# Patient Record
Sex: Female | Born: 1985 | Race: Black or African American | Hispanic: No | Marital: Married | Smoking: Former smoker
Health system: Southern US, Community
[De-identification: ages and names within clinical notes are randomized; demographics above are authoritative.]

## PROBLEM LIST (undated history)

## (undated) DIAGNOSIS — I471 Supraventricular tachycardia, unspecified: Secondary | ICD-10-CM

## (undated) HISTORY — PX: OTHER SURGICAL HISTORY: SHX169

## (undated) HISTORY — DX: Supraventricular tachycardia, unspecified: I47.10

## (undated) HISTORY — DX: Supraventricular tachycardia: I47.1

---

## 2006-05-20 ENCOUNTER — Ambulatory Visit: Payer: Self-pay | Admitting: Internal Medicine

## 2006-10-04 ENCOUNTER — Ambulatory Visit: Payer: Self-pay | Admitting: Family Medicine

## 2006-12-28 ENCOUNTER — Emergency Department (HOSPITAL_COMMUNITY): Admission: EM | Admit: 2006-12-28 | Discharge: 2006-12-28 | Payer: Self-pay | Admitting: Emergency Medicine

## 2007-08-21 ENCOUNTER — Ambulatory Visit: Payer: Self-pay | Admitting: Internal Medicine

## 2008-09-16 IMAGING — CR DG LUMBAR SPINE COMPLETE 4+V
6 series · 6 of 6 positions shown · non-contrast
Comparison: none

CORRECTED EXAM 12/31/06 ? This exam was cancelled after it was ordered, but the tech inadvertently performed the exam in error.  The patient will not be charged for this exam.
CLINICAL DATA: Severe back pain.  The patient was not charged for this exam, because she had already had the exam earlier today. 
 LUMBAR SPINE ? 6 VIEW:

[t l-spine oblique exposure (1 of 3)]
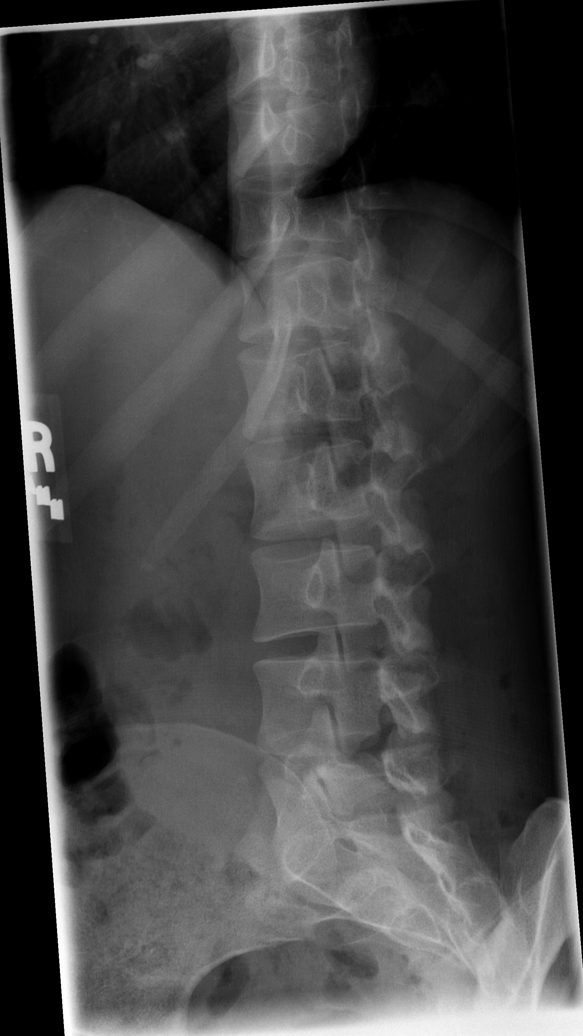

[t l-spine oblique exposure (2 of 3)]
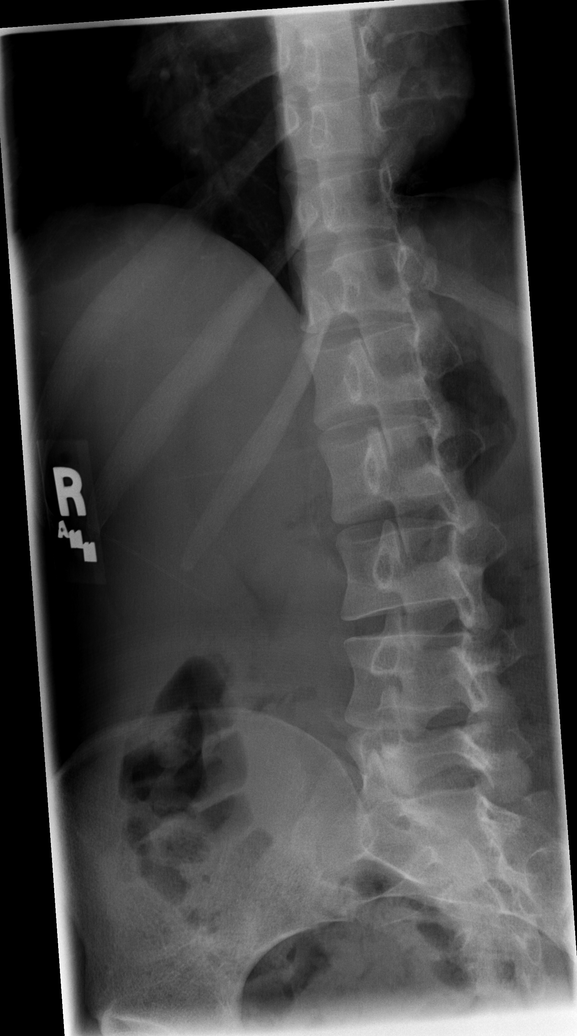

[t l-spine oblique exposure (3 of 3)]
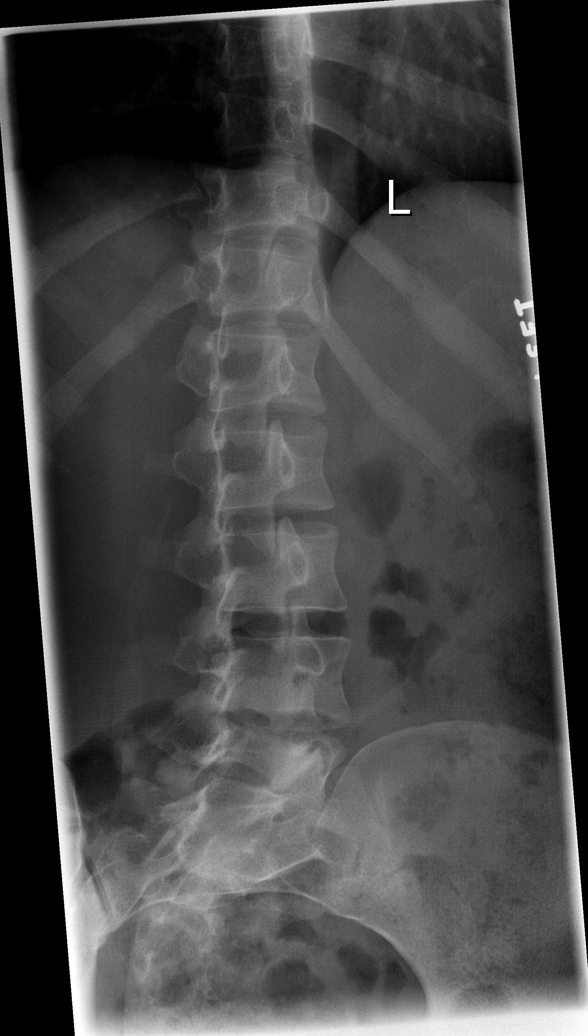

[t l-spine lat]
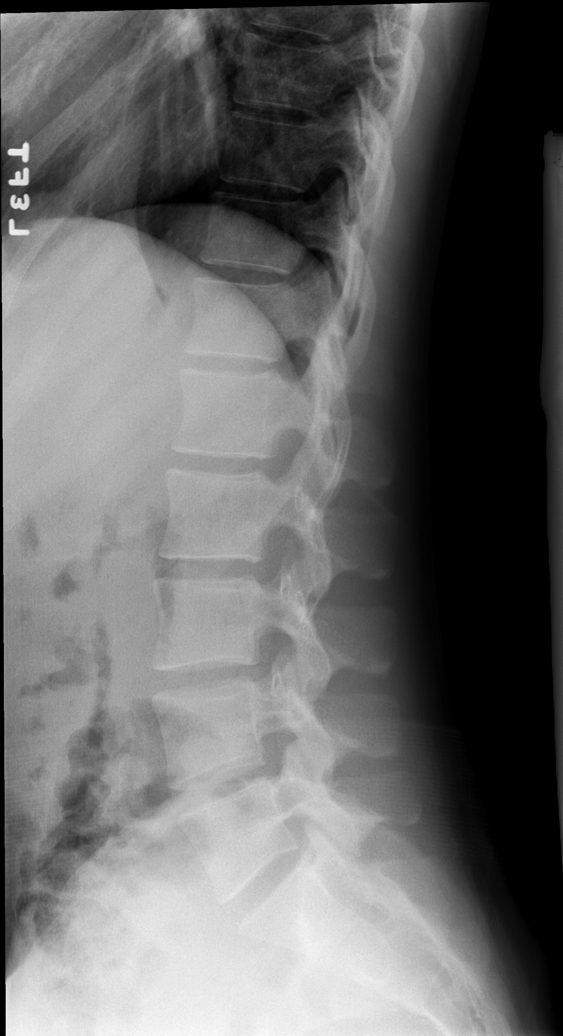

[t l-spine l5-s1 spot]
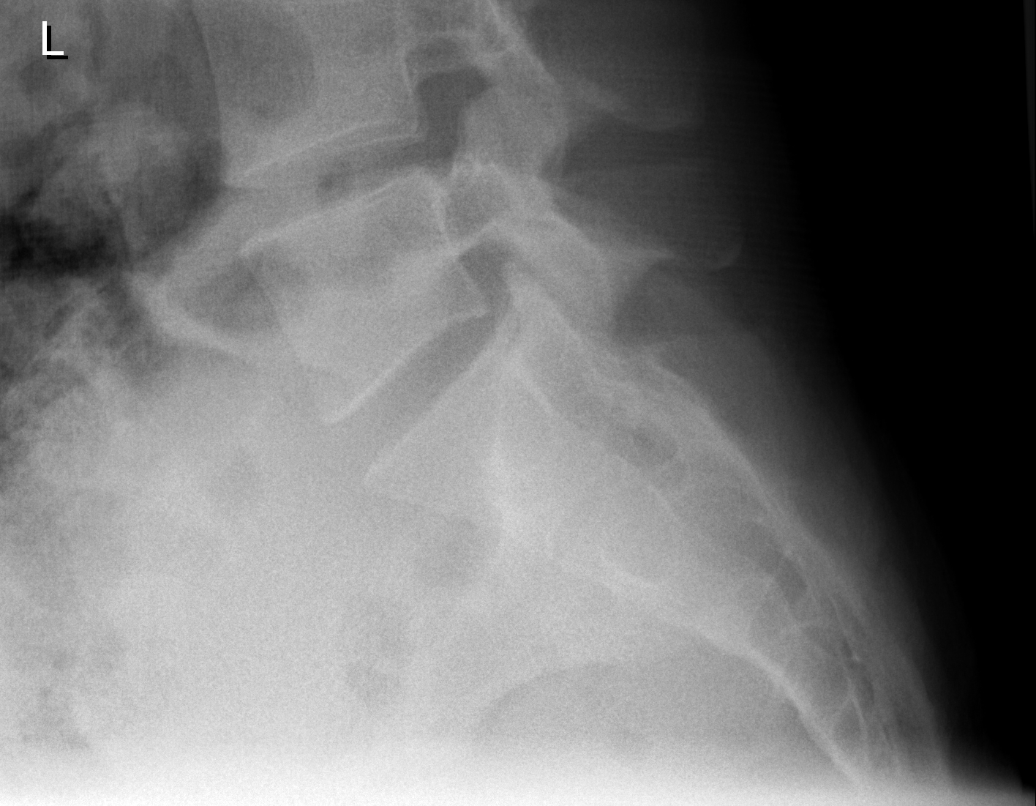

[t l-spine a.p.]
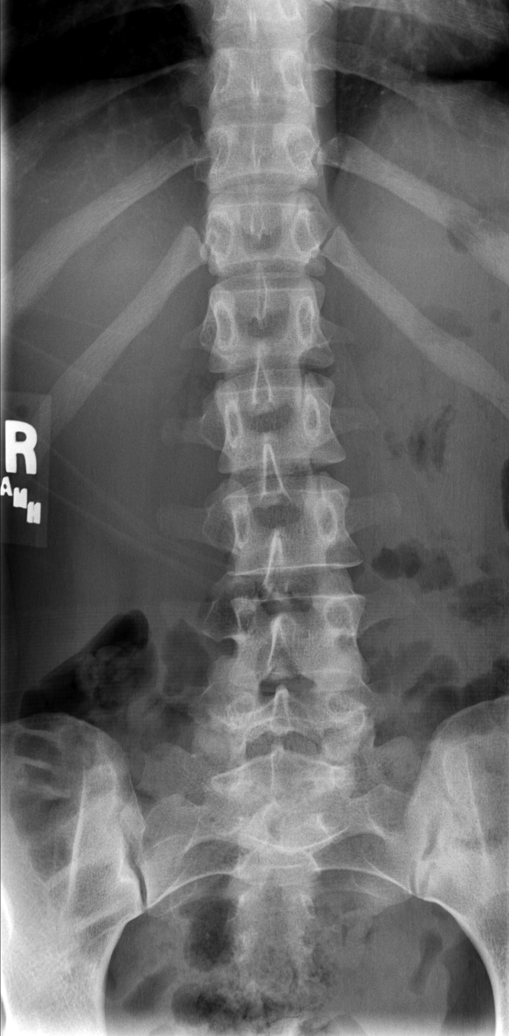

[6 of 6 positions shown; findings below may reference images not displayed]

FINDINGS: There is no evidence of lumbar spine fracture.  Alignment is normal.  Intervertebral disc spaces are maintained, and no other significant bone abnormalities are identified.
IMPRESSION: Negative lumbar spine radiographs.

## 2009-01-04 DIAGNOSIS — R000 Tachycardia, unspecified: Secondary | ICD-10-CM | POA: Insufficient documentation

## 2009-01-18 ENCOUNTER — Encounter (INDEPENDENT_AMBULATORY_CARE_PROVIDER_SITE_OTHER): Payer: Self-pay | Admitting: *Deleted

## 2011-02-01 LAB — URINALYSIS, ROUTINE W REFLEX MICROSCOPIC
Glucose, UA: NEGATIVE
Protein, ur: 30 — AB
pH: 6

## 2011-02-01 LAB — URINE MICROSCOPIC-ADD ON

## 2011-02-01 LAB — POCT PREGNANCY, URINE: Preg Test, Ur: NEGATIVE

## 2015-11-13 ENCOUNTER — Emergency Department (HOSPITAL_COMMUNITY)
Admission: EM | Admit: 2015-11-13 | Discharge: 2015-11-14 | Disposition: A | Discharge status: Home / Self Care | Payer: Self-pay | Attending: Dermatology | Admitting: Dermatology

## 2015-11-13 ENCOUNTER — Encounter (HOSPITAL_COMMUNITY): Payer: Self-pay | Admitting: Emergency Medicine

## 2015-11-13 ENCOUNTER — Emergency Department (HOSPITAL_COMMUNITY): Payer: Self-pay

## 2015-11-13 DIAGNOSIS — R202 Paresthesia of skin: Secondary | ICD-10-CM | POA: Insufficient documentation

## 2015-11-13 DIAGNOSIS — Z5321 Procedure and treatment not carried out due to patient leaving prior to being seen by health care provider: Secondary | ICD-10-CM | POA: Insufficient documentation

## 2015-11-13 DIAGNOSIS — Z87891 Personal history of nicotine dependence: Secondary | ICD-10-CM | POA: Insufficient documentation

## 2015-11-13 LAB — BASIC METABOLIC PANEL
ANION GAP: 4 — AB (ref 5–15)
BUN: 12 mg/dL (ref 6–20)
CALCIUM: 9.8 mg/dL (ref 8.9–10.3)
CO2: 27 mmol/L (ref 22–32)
Chloride: 110 mmol/L (ref 101–111)
Creatinine, Ser: 0.63 mg/dL (ref 0.44–1.00)
GFR calc Af Amer: >60 mL/min (ref >60)
GLUCOSE: 87 mg/dL (ref 65–99)
POTASSIUM: 4.3 mmol/L (ref 3.5–5.1)
SODIUM: 141 mmol/L (ref 135–145)

## 2015-11-13 LAB — I-STAT TROPONIN, ED: TROPONIN I, POC: 0.01 ng/mL (ref 0.00–0.08)

## 2015-11-13 LAB — CBC
HEMATOCRIT: 40.8 % (ref 36.0–46.0)
HEMOGLOBIN: 13.7 g/dL (ref 12.0–15.0)
MCH: 33.1 pg (ref 26.0–34.0)
MCHC: 33.6 g/dL (ref 30.0–36.0)
MCV: 98.6 fL (ref 78.0–100.0)
Platelets: 206 10*3/uL (ref 150–400)
RBC: 4.14 MIL/uL (ref 3.87–5.11)
RDW: 12.2 % (ref 11.5–15.5)
WBC: 12.3 10*3/uL — AB (ref 4.0–10.5)

## 2015-11-13 NOTE — ED Triage Notes (Signed)
Pt states about an hour ago she had a sudden onset of chest tightness and tingling in her arms/hands. Pt seemed anxious at first with HR of 140, pt now calmed down and HR 112. Pt states tingling as eased up. Reports this has happened before but "they/ve never figured out why this is happening."

## 2015-11-14 ENCOUNTER — Encounter (HOSPITAL_COMMUNITY): Payer: Self-pay

## 2015-11-14 ENCOUNTER — Emergency Department (HOSPITAL_COMMUNITY)
Admission: EM | Admit: 2015-11-14 | Discharge: 2015-11-14 | Disposition: A | Discharge status: Home / Self Care | Payer: BLUE CROSS/BLUE SHIELD | Attending: Emergency Medicine | Admitting: Emergency Medicine

## 2015-11-14 DIAGNOSIS — R202 Paresthesia of skin: Secondary | ICD-10-CM | POA: Diagnosis not present

## 2015-11-14 DIAGNOSIS — R002 Palpitations: Secondary | ICD-10-CM

## 2015-11-14 DIAGNOSIS — R0789 Other chest pain: Secondary | ICD-10-CM | POA: Insufficient documentation

## 2015-11-14 DIAGNOSIS — Z87891 Personal history of nicotine dependence: Secondary | ICD-10-CM | POA: Diagnosis not present

## 2015-11-14 DIAGNOSIS — R0602 Shortness of breath: Secondary | ICD-10-CM | POA: Diagnosis present

## 2015-11-14 LAB — BASIC METABOLIC PANEL
Anion gap: 7 (ref 5–15)
BUN: 9 mg/dL (ref 6–20)
CHLORIDE: 108 mmol/L (ref 101–111)
CO2: 21 mmol/L — AB (ref 22–32)
Calcium: 9.5 mg/dL (ref 8.9–10.3)
Creatinine, Ser: 0.62 mg/dL (ref 0.44–1.00)
GFR calc non Af Amer: >60 mL/min (ref >60)
Glucose, Bld: 88 mg/dL (ref 65–99)
POTASSIUM: 3.8 mmol/L (ref 3.5–5.1)
SODIUM: 136 mmol/L (ref 135–145)

## 2015-11-14 LAB — CBC WITH DIFFERENTIAL/PLATELET
Basophils Absolute: 0 10*3/uL (ref 0.0–0.1)
Basophils Relative: 0 %
Eosinophils Absolute: 0 10*3/uL (ref 0.0–0.7)
Eosinophils Relative: 0 %
HEMATOCRIT: 39.1 % (ref 36.0–46.0)
HEMOGLOBIN: 13.3 g/dL (ref 12.0–15.0)
LYMPHS ABS: 2.2 10*3/uL (ref 0.7–4.0)
LYMPHS PCT: 27 %
MCH: 32.4 pg (ref 26.0–34.0)
MCHC: 34 g/dL (ref 30.0–36.0)
MCV: 95.1 fL (ref 78.0–100.0)
MONOS PCT: 7 %
Monocytes Absolute: 0.5 10*3/uL (ref 0.1–1.0)
NEUTROS PCT: 66 %
Neutro Abs: 5.4 10*3/uL (ref 1.7–7.7)
Platelets: 198 10*3/uL (ref 150–400)
RBC: 4.11 MIL/uL (ref 3.87–5.11)
RDW: 12.4 % (ref 11.5–15.5)
WBC: 8.2 10*3/uL (ref 4.0–10.5)

## 2015-11-14 NOTE — ED Triage Notes (Signed)
Pt here yesterday for same. States she was at work and felt her heart increase in rate.  Felt dizzy with chest tightness.  Shortness of breath.  Lasted over an hour.  Came to ED.  Labs and EKG completed and triaged to waiting.  Left d/t wait times.  Pt denies symptoms since.  States she has had this in past.

## 2015-11-14 NOTE — ED Provider Notes (Signed)
[facility]PT Provider Note   CSN: [id number] Arrival date & time: 11/14/15  4628  First Provider Contact:  10:45      History   Chief Complaint Chief Complaint  Patient presents with  . Palpitations    HPI Julia Cox is a 30 y.o. female here for evaluation of palpitations. Patient reports yesterday at work, while standing she suddenly felt very hot, felt like her heart was racing, became short of breath with chest tightness and experienced bilateral numbness and tingling in her hands. Symptoms lasted approximately one hour and resolved spontaneously. She denies any exertional component. States he works out regularly and has never felt this discomfort. She does admit to having a 5 hour energy supplement prior to this episode. Denies any discomfort now in the emergency department states she came to ED last night, had multiple tests done, but left before being seen by provider.  HPI  No past medical history on file.  Patient Active Problem List   Diagnosis Date Noted  . 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  OB History    No data available       Home Medications    Prior to Admission medications   Not on File    Family History No family history on file.  Social History Social History  Substance Use Topics  . Smoking status: Former Games developer  . Smokeless tobacco: Not on file  . Alcohol use No     Allergies   Prednisone   Review of Systems Review of Systems A 10 point review of systems was completed and was negative except for pertinent positives and negatives as mentioned in the history of present illness    Physical Exam Updated Vital Signs BP 101/72   Pulse 70   Temp 98.4 F (36.9 C) (Oral)   Resp 18   LMP 11/05/2015   SpO2 99%   Physical Exam  Constitutional: She appears well-developed. No distress.  Awake, alert and nontoxic in appearance  HENT:  Head: Normocephalic and atraumatic.  Right Ear: External ear  normal.  Left Ear: External ear normal.  Mouth/Throat: Oropharynx is clear and moist.  Eyes: Conjunctivae and EOM are normal. Pupils are equal, round, and reactive to light.  Neck: Normal range of motion. No JVD present.  Cardiovascular: Normal rate, regular rhythm and normal heart sounds.   Pulmonary/Chest: Effort normal and breath sounds normal. No stridor.  Abdominal: Soft. There is no tenderness.  Musculoskeletal: Normal range of motion.  Neurological:  Awake, alert, cooperative and aware of situation; motor strength bilaterally; sensation normal to light touch bilaterally; no facial asymmetry; tongue midline; major cranial nerves appear intact;  baseline gait without new ataxia.  Skin: No rash noted. She is not diaphoretic.  Psychiatric: She has a normal mood and affect. Her behavior is normal. Thought content normal.  Nursing note and vitals reviewed.    ED Treatments / Results  Labs (all labs ordered are listed, but only abnormal results are displayed) Labs Reviewed  BASIC METABOLIC PANEL - Abnormal; Notable for the following:       Result Value   CO2 21 (*)    All other components within normal limits  CBC WITH DIFFERENTIAL/PLATELET    EKG  EKG Interpretation  Date/Time:  Tuesday November 14 2015 07:50:12 EDT Ventricular Rate:  95 PR Interval:    QRS Duration: 86 QT Interval:  329 QTC Calculation: 414 R Axis:   94 Text Interpretation:  Sinus rhythm  Borderline short PR interval Right atrial enlargement Borderline right axis deviation Borderline repolarization abnormality When compared with ECG of 11/13/2015 Rate slower Otherwise no significant change Confirmed by [facility]  MD, Nicholos Johns [phone]) on 11/14/2015 10:13:04 AM       Radiology Dg Chest 2 View  Result Date: 11/13/2015 CLINICAL DATA:  Chest pain and tightness, initial encounter EXAM: CHEST  2 VIEW COMPARISON:  None. FINDINGS: The heart size and mediastinal contours are within normal limits. Both lungs are clear.  The visualized skeletal structures are unremarkable. IMPRESSION: No active cardiopulmonary disease. Electronically Signed   By: Alcide Clever M.D.   On: 11/13/2015 20:01   Procedures Procedures (including critical care time)  Medications Ordered in ED Medications - No data to display   Initial Impression / Assessment and Plan / ED Course  I have reviewed the triage vital signs and the nursing notes.  Pertinent labs & imaging results that were available during my care of the patient were reviewed by me and considered in my medical decision making (see chart for details).  Clinical Course    Patient with palpitations, concomitant shortness of breath and chest tightness, bilateral hand paresthesia. Symptoms possibly due to anxiety. Patient also admits to drinking an energy drink prior to symptom onset. I discussed cessation of energy during until reevaluated by cardiology. Exam was reassuring. EKG unremarkable. Screening labs, troponin and chest x-ray ordered yesterday are negative. Repeat labs today are reassuring and unremarkable. Given referral to cardiology for reevaluation. Patient denies he discomfort now, appears very well and appropriate for outpatient follow-up. Discussed strict return precautions with patient and family bedside, verbalize understanding and agreement plan and subsequent discharge Prior to patient discharge, I discussed and reviewed this case with Dr.McManus  Final Clinical Impressions(s) / ED Diagnoses   Final diagnoses:  Palpitations    New Prescriptions There are no discharge medications for this patient.    Joycie Peek, PA-C 11/15/15 5638    Samuel Jester, DO 11/18/15 1213

## 2015-11-14 NOTE — ED Notes (Signed)
Writer call for room assignment, no response  

## 2015-11-14 NOTE — Discharge Instructions (Signed)
Please follow-up with your doctor and cardiology for further evaluation and management of your symptoms. Your exam today was reassuring as were her labs. Avoid drinking energy drinks as this can exacerbate your symptoms. Return to ED for new or worsening symptoms as we discussed.

## 2016-04-28 ENCOUNTER — Encounter (HOSPITAL_COMMUNITY): Payer: Self-pay | Admitting: *Deleted

## 2016-04-28 ENCOUNTER — Emergency Department (HOSPITAL_COMMUNITY)
Admission: EM | Admit: 2016-04-28 | Discharge: 2016-04-28 | Disposition: A | Discharge status: Home / Self Care | Payer: BLUE CROSS/BLUE SHIELD | Attending: Emergency Medicine | Admitting: Emergency Medicine

## 2016-04-28 ENCOUNTER — Emergency Department (HOSPITAL_COMMUNITY): Payer: BLUE CROSS/BLUE SHIELD

## 2016-04-28 DIAGNOSIS — Z87891 Personal history of nicotine dependence: Secondary | ICD-10-CM | POA: Insufficient documentation

## 2016-04-28 DIAGNOSIS — R000 Tachycardia, unspecified: Secondary | ICD-10-CM | POA: Insufficient documentation

## 2016-04-28 LAB — COMPREHENSIVE METABOLIC PANEL
ALT: 17 U/L (ref 14–54)
AST: 24 U/L (ref 15–41)
Albumin: 4.5 g/dL (ref 3.5–5.0)
Alkaline Phosphatase: 54 U/L (ref 38–126)
Anion gap: 5 (ref 5–15)
BUN: 5 mg/dL — AB (ref 6–20)
CHLORIDE: 113 mmol/L — AB (ref 101–111)
CO2: 19 mmol/L — AB (ref 22–32)
CREATININE: 0.51 mg/dL (ref 0.44–1.00)
Calcium: 9.8 mg/dL (ref 8.9–10.3)
GFR calc Af Amer: >60 mL/min (ref >60)
Glucose, Bld: 90 mg/dL (ref 65–99)
Potassium: 3.9 mmol/L (ref 3.5–5.1)
Sodium: 137 mmol/L (ref 135–145)
Total Bilirubin: 0.8 mg/dL (ref 0.3–1.2)
Total Protein: 7.4 g/dL (ref 6.5–8.1)

## 2016-04-28 LAB — CBC WITH DIFFERENTIAL/PLATELET
BASOS ABS: 0 10*3/uL (ref 0.0–0.1)
Basophils Relative: 0 %
EOS PCT: 0 %
Eosinophils Absolute: 0 10*3/uL (ref 0.0–0.7)
HCT: 39.7 % (ref 36.0–46.0)
Hemoglobin: 14 g/dL (ref 12.0–15.0)
LYMPHS PCT: 34 %
Lymphs Abs: 3 10*3/uL (ref 0.7–4.0)
MCH: 33.2 pg (ref 26.0–34.0)
MCHC: 35.3 g/dL (ref 30.0–36.0)
MCV: 94.1 fL (ref 78.0–100.0)
Monocytes Absolute: 0.6 10*3/uL (ref 0.1–1.0)
Monocytes Relative: 7 %
NEUTROS ABS: 5.3 10*3/uL (ref 1.7–7.7)
NEUTROS PCT: 59 %
PLATELETS: 174 10*3/uL (ref 150–400)
RBC: 4.22 MIL/uL (ref 3.87–5.11)
RDW: 12.2 % (ref 11.5–15.5)
WBC: 8.8 10*3/uL (ref 4.0–10.5)

## 2016-04-28 LAB — TROPONIN I

## 2016-04-28 LAB — TSH: TSH: 1.58 u[IU]/mL (ref 0.350–4.500)

## 2016-04-28 NOTE — ED Notes (Signed)
Pt has no complaints back from xray 5 or 10 minutes

## 2016-04-28 NOTE — Discharge Instructions (Signed)
Tests were all within normal limits. Recommend follow-up with cardiology. [facility] [phone]

## 2016-04-28 NOTE — ED Notes (Addendum)
Informed Dr. Adriana Simasook that pt was hungry and was wondering "what is going on". Pt offered a sandwich and drink but refused, due to being vegan.

## 2016-04-28 NOTE — ED Provider Notes (Signed)
[facility] Provider Note   CSN: [id number] Arrival date & time: 04/28/16  1554     History   Chief Complaint Chief Complaint  Patient presents with  . Tachycardia    HPI Julia Cox is a 31 y.o. female.  Episode of tachycardia earlier today since 10 AM, now resolved. Patient went to the urgent care center where her EKG revealed sinus tachycardia with a rate of 146. No substernal chest pain or dyspnea. Patient has had similar episodes since age 31, worsening since July 2017, approximately 1-2 episodes per month. She had a 30 day Holter  monitor placed years ago which showed no ectopy. 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 No street drugs or tobacco.      History reviewed. No pertinent past medical history.  Patient Active Problem List   Diagnosis Date Noted  . 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 No pertinent surgical history.  OB History    No data available       Home Medications    Prior to Admission medications   Not on File    Family History No family history on file.  Social History Social History  Substance Use Topics  . Smoking status: Former Games developermoker  . Smokeless tobacco: Never Used  . Alcohol use Yes     Allergies   Prednisone   Review of Systems Review of Systems  All other systems reviewed and are negative.    Physical Exam Updated Vital Signs BP 127/93 (BP Location: Right Arm)   Pulse 90   Temp 98.7 F (37.1 C) (Oral)   Resp 11   Ht 5\' 3"  (1.6 m)   Wt 112 lb (50.8 kg)   LMP 04/05/2016   SpO2 100%   BMI 19.84 kg/m   Physical Exam  Constitutional: She is oriented to person, place, and time. She appears well-developed and well-nourished.  HENT:  Head: Normocephalic and atraumatic.  Eyes: Conjunctivae are normal.  Neck: Neck supple.  Cardiovascular: Regular rhythm.   Tachycardia at 110/min  Pulmonary/Chest: Effort normal and breath sounds normal.  Abdominal: Soft. Bowel sounds are normal.    Musculoskeletal: Normal range of motion.  Neurological: She is alert and oriented to person, place, and time.  Skin: Skin is warm and dry.  Psychiatric: She has a normal mood and affect. Her behavior is normal.  Nursing note and vitals reviewed.    ED Treatments / Results  Labs (all labs ordered are listed, but only abnormal results are displayed) Labs Reviewed  COMPREHENSIVE METABOLIC PANEL - Abnormal; Notable for the following:       Result Value   Chloride 113 (*)    CO2 19 (*)    BUN 5 (*)    All other components within normal limits  CBC WITH DIFFERENTIAL/PLATELET  TROPONIN I  TSH    EKG  EKG Interpretation  Date/Time:  Sunday April 28 2016 15:54:23 EST Ventricular Rate:  110 PR Interval:    QRS Duration: 72 QT Interval:  312 QTC Calculation: 422 R Axis:   85 Text Interpretation:  Sinus tachycardia Right atrial enlargement Confirmed by Adriana SimasOOK  MD, Amarilys Lyles ([id number]) on 04/28/2016 4:27:24 PM       Radiology Dg Chest 2 View  Result Date: 04/28/2016 CLINICAL DATA:  31 year old with recurrent episodes of tachycardia, with today's episode starting at approximately 10 o'clock a.m. Her previous episode was approximately 1 month ago. EXAM: CHEST  2 VIEW COMPARISON:  11/13/2015. FINDINGS: Cardiomediastinal silhouette unremarkable,  unchanged. Lungs clear. Bronchovascular markings normal. Pulmonary vascularity normal. No visible pleural effusions. No pneumothorax. Visualized bony thorax intact. No interval change. IMPRESSION: Normal examination. Electronically Signed   By: Hulan Saas M.D.   On: 04/28/2016 17:38    Procedures Procedures (including critical care time)  Medications Ordered in ED Medications - No data to display   Initial Impression / Assessment and Plan / ED Course  I have reviewed the triage vital signs and the nursing notes.  Pertinent labs & imaging results that were available during my care of the patient were reviewed by me and considered in my  medical decision making (see chart for details).  Clinical Course     Patient is hemodynamically stable. Her heart rate now is in the 100 range. Screening labs normal. Patient will follow-up with [facility] cardiology  Final Clinical Impressions(s) / ED Diagnoses   Final diagnoses:  Sinus tachycardia    New Prescriptions New Prescriptions   No medications on file     Donnetta Hutching, MD 04/28/16 2023

## 2016-04-28 NOTE — ED Triage Notes (Signed)
The pt arrived by [facility] from an urgent care in [place] she has had a fast heart rate since 1000am today  No pain just uncomfortable feeling in her chest.  She has a history of the same complaints in the past.  Last episode was one month ago.  lmp dec 15

## 2016-04-30 ENCOUNTER — Telehealth: Payer: Self-pay | Admitting: *Deleted

## 2016-04-30 NOTE — Telephone Encounter (Signed)
Pt called requesting referral from EDP she saw two days ago be sent to her cardiology office.  EDCM advised pt that the particular EDP Adriana Simas(Cook) she saw that day is not here today.  Pt sounded upset that she may loose her appoinment.  [facility] office [facility]) in hopes that this will help pt keep appointment.

## 2016-05-02 ENCOUNTER — Ambulatory Visit (INDEPENDENT_AMBULATORY_CARE_PROVIDER_SITE_OTHER): Payer: BLUE CROSS/BLUE SHIELD | Admitting: Cardiology

## 2016-05-02 ENCOUNTER — Encounter: Payer: Self-pay | Admitting: Cardiology

## 2016-05-02 VITALS — BP 120/78 | HR 83 | Ht 63.0 in | Wt 112.0 lb

## 2016-05-02 DIAGNOSIS — I471 Supraventricular tachycardia: Secondary | ICD-10-CM

## 2016-05-02 MED ORDER — METOPROLOL SUCCINATE ER 25 MG PO TB24: 25.0000 mg | ORAL_TABLET | Freq: Every day | ORAL | 12 refills | Status: DC

## 2016-05-02 NOTE — Progress Notes (Signed)
   Referring: Dr Donnetta HutchingBrian Cook   HPI: 31 year old female for evaluation of SVT. Patient seen in the emergency room January 7 with palpitations. Electrocardiogram showed sinus tachycardia with right atrial enlargement. Chest x-ray negative. TSH, hemoglobin, potassium and troponin normal. Note prior to her evaluation in the emergency room she had been seen at urgent care and her electrocardiogram showed SVT at a rate of 150. Patient has had intermittent palpitations since age 31. They are described as her heart racing and can be associated with chest tightness and dyspnea. No syncope. She wore an event monitor years ago but did not have symptoms with it in place. Her symptoms are increasing in frequency and severity. Her most recent episode lasted 6 hours and she presented to urgent care and had the electrocardiogram demonstrated SVT. She otherwise denies dyspnea on exertion, orthopnea, PND, pedal edema, exertional chest pain or syncope. Cardiology asked to evaluate.   No current outpatient prescriptions on file.   No current facility-administered medications for this visit.     Allergies  Allergen Reactions  . Prednisone Other (See Comments)    Everything hurts- hair, skin, etc..     Past Medical History:  Diagnosis Date  . SVT (supraventricular tachycardia) (HCC)     Past Surgical History:  Procedure Laterality Date  . No prior surgical history      Social History   Social History  . Marital status: Married    Spouse name: N/A  . Number of children: N/A  . Years of education: N/A   Occupational History  .      Hairdresser   Social History Main Topics  . Smoking status: Former Games developermoker  . Smokeless tobacco: Never Used  . Alcohol use Yes     Comment: Occasional  . Drug use: No  . Sexual activity: Not on file   Other Topics Concern  . Not on file   Social History Narrative  . No narrative on file    Family History  Problem Relation Age of Onset  . Hypertension Mother    . Hypertension Father   . Stroke Father     ROS: no fevers or chills, productive cough, hemoptysis, dysphasia, odynophagia, melena, hematochezia, dysuria, hematuria, rash, seizure activity, orthopnea, PND, pedal edema, claudication. Remaining systems are negative.  Physical Exam:   Blood pressure 120/78, pulse 83, height 5\' 3"  (1.6 m), weight 112 lb (50.8 kg), last menstrual period 04/05/2016.  General:  Well developed/well nourished in NAD Skin warm/dry; tattoos noted Patient not depressed No peripheral clubbing Back-normal HEENT-normal/normal eyelids Neck supple/normal carotid upstroke bilaterally; no bruits; no JVD; no thyromegaly chest - CTA/ normal expansion CV - RRR/normal S1 and S2; no murmurs rubs or gallops;  PMI nondisplaced Abdomen -NT/ND, no HSM, no mass, + bowel sounds, no bruit 2+ femoral pulses, no bruits Ext-no edema, chords, 2+ DP Neuro-grossly nonfocal  ECG 04/28/2016-sinus tachycardia, right atrial enlargement.  Electrocardiogram today shows sinus rhythm with anterior T-wave inversion.  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 She has a long history of recurrent episodes which are increasing in frequency and severity. Add Toprol 25 mg by mouth daily at bedtime. Schedule echocardiogram to further assess LV function. Refer to electrophysiology for consideration of ablation. I have asked her to take her electrocardiogram with her at time of evaluation. We have also made a copy for the chart.  Julia MillersBrian Crenshaw, MD

## 2016-05-02 NOTE — Patient Instructions (Signed)
Medication Instructions:   START METOPROLOL SUCC ER 25 MG ONCE DAILY AT BEDTIME  Testing/Procedures:  Your physician has requested that you have an echocardiogram. Echocardiography is a painless test that uses sound waves to create images of your heart. It provides your doctor with information about the size and shape of your heart and how well your heart's chambers and valves are working. This procedure takes approximately one hour. There are no restrictions for this procedure.    Follow-Up:  Your physician recommends that you schedule a follow-up appointment in: AS NEEDED WITH DR CRENSHAW  APPOINTMENT WITH EP TO DISCUSS SVT ABLATION

## 2016-05-13 NOTE — Addendum Note (Signed)
Addended by: Raelyn NumberWILLIAMSON, Harrel Ferrone L on: 05/13/2016 09:44 AM   Modules accepted: Orders

## 2016-05-21 ENCOUNTER — Ambulatory Visit (INDEPENDENT_AMBULATORY_CARE_PROVIDER_SITE_OTHER): Payer: BLUE CROSS/BLUE SHIELD | Admitting: Internal Medicine

## 2016-05-21 ENCOUNTER — Encounter: Payer: Self-pay | Admitting: Internal Medicine

## 2016-05-21 ENCOUNTER — Other Ambulatory Visit: Payer: Self-pay

## 2016-05-21 ENCOUNTER — Encounter (INDEPENDENT_AMBULATORY_CARE_PROVIDER_SITE_OTHER): Payer: Self-pay

## 2016-05-21 ENCOUNTER — Ambulatory Visit (HOSPITAL_COMMUNITY): Payer: BLUE CROSS/BLUE SHIELD | Attending: Cardiovascular Disease

## 2016-05-21 VITALS — BP 110/82 | HR 59 | Ht 63.0 in | Wt 112.4 lb

## 2016-05-21 DIAGNOSIS — I471 Supraventricular tachycardia: Secondary | ICD-10-CM

## 2016-05-21 DIAGNOSIS — I361 Nonrheumatic tricuspid (valve) insufficiency: Secondary | ICD-10-CM | POA: Diagnosis not present

## 2016-05-21 DIAGNOSIS — I341 Nonrheumatic mitral (valve) prolapse: Secondary | ICD-10-CM | POA: Diagnosis not present

## 2016-05-21 NOTE — Progress Notes (Signed)
      HPI [name] is referred today by Dr. Jens Somrenshaw for evaluation of SVT. She has had a long h/o tachypalpitations dating back to age 31. These usually can be stopped by deep breathing or relaxation. The patient notes that her episodes start and stop suddenly and when they persist are associated with sob. She feels weak after. No syncope. She went to an urgent care with a long episode and an ECG shows a short RP tachycardia at 150/min. This stopped before she was given IV adenosine.  Allergies  Allergen Reactions  . Prednisone Other (See Comments)    Everything hurts- hair, skin, etc..     Current Outpatient Prescriptions  Medication Sig Dispense Refill  . metoprolol succinate (TOPROL XL) 25 MG 24 hr tablet Take 1 tablet (25 mg total) by mouth daily. 30 tablet 12   No current facility-administered medications for this visit.      Past Medical History:  Diagnosis Date  . SVT (supraventricular tachycardia) (HCC)     ROS:   All systems reviewed and negative except as noted in the HPI.   Past Surgical History:  Procedure Laterality Date  . No prior surgical history       Family History  Problem Relation Age of Onset  . Hypertension Mother   . Hypertension Father   . Stroke Father      Social History   Social History  . Marital status: Married    Spouse name: N/A  . Number of children: N/A  . Years of education: N/A   Occupational History  .      Hairdresser   Social History Main Topics  . Smoking status: Former Games developermoker  . Smokeless tobacco: Never Used  . Alcohol use Yes     Comment: Occasional  . Drug use: No  . Sexual activity: Yes    Birth control/ protection: None   Other Topics Concern  . Not on file   Social History Narrative  . No narrative on file     BP 110/82   Pulse (!) 59   Ht 5\' 3"  (1.6 m)   Wt 112 lb 6.4 oz (51 kg)   LMP 05/03/2016 (Approximate)   SpO2 98%   BMI 19.91 kg/m   Physical Exam:  Well appearing young woman,  NAD HEENT: Unremarkable Neck:  No JVD, no thyromegally Lymphatics:  No adenopathy Back:  No CVA tenderness Lungs:  Clear with no wheezes HEART:  Regular rate rhythm, no murmurs, no rubs, no clicks Abd:  soft, positive bowel sounds, no organomegally, no rebound, no guarding Ext:  2 plus pulses, no edema, no cyanosis, no clubbing Skin:  No rashes no nodules Neuro:  CN II through XII intact, motor grossly intact  EKG - NSR with a short RP interval and no pre-excitation  DEVICE  Normal device function.  See PaceArt for details.   Assess/Plan: 1. SVT - she is doing well with her beta blocker therapy. I have also discussed catheter ablation with the patient. No indication for ablation as she has felt well on this medication and has not had recurrent SVT. Julia Cox,M.D.

## 2016-05-21 NOTE — Patient Instructions (Signed)

## 2017-05-30 ENCOUNTER — Other Ambulatory Visit: Payer: Self-pay | Admitting: Cardiology

## 2017-05-30 DIAGNOSIS — I471 Supraventricular tachycardia: Secondary | ICD-10-CM

## 2017-06-13 ENCOUNTER — Ambulatory Visit (INDEPENDENT_AMBULATORY_CARE_PROVIDER_SITE_OTHER): Payer: BLUE CROSS/BLUE SHIELD | Admitting: Internal Medicine

## 2017-06-13 VITALS — BP 108/80 | HR 56 | Ht 63.0 in | Wt 121.0 lb

## 2017-06-13 DIAGNOSIS — I471 Supraventricular tachycardia: Secondary | ICD-10-CM | POA: Diagnosis not present

## 2017-06-13 NOTE — Progress Notes (Signed)
      HPI [name] returns today for ongoing evaluation of SVT. She has had a long h/o tachypalpitations dating back to age 32. These usually can be stopped by deep breathing or relaxation. The patient notes that her episodes start and stop suddenly and when they persist are associated with sob. She feels weak after. No syncope. She has had a single episode of SVT since I saw her last a year ago which stopped with deep breathing. She feels well and has had no trouble taking her toprol.   Allergies  Allergen Reactions  . Prednisone Other (See Comments)    Everything hurts- hair, skin, etc..     Current Outpatient Medications  Medication Sig Dispense Refill  . metoprolol succinate (TOPROL-XL) 25 MG 24 hr tablet TAKE 1 TABLET BY MOUTH DAILY 30 tablet 0   No current facility-administered medications for this visit.      Past Medical History:  Diagnosis Date  . SVT (supraventricular tachycardia) (HCC)     ROS:   All systems reviewed and negative except as noted in the HPI.   Past Surgical History:  Procedure Laterality Date  . No prior surgical history       Family History  Problem Relation Age of Onset  . Hypertension Mother   . Hypertension Father   . Stroke Father      Social History   Socioeconomic History  . Marital status: Married    Spouse name: Not on file  . Number of children: Not on file  . Years of education: Not on file  . Highest education level: Not on file  Social Needs  . Financial resource strain: Not on file  . Food insecurity - worry: Not on file  . Food insecurity - inability: Not on file  . Transportation needs - medical: Not on file  . Transportation needs - non-medical: Not on file  Occupational History    Comment: Hairdresser  Tobacco Use  . Smoking status: Former Games developermoker  . Smokeless tobacco: Never Used  Substance and Sexual Activity  . Alcohol use: Yes    Comment: Occasional  . Drug use: No  . Sexual activity: Yes    Birth  control/protection: None  Other Topics Concern  . Not on file  Social History Narrative  . Not on file     BP 108/80   Pulse (!) 56   Ht 5\' 3"  (1.6 m)   Wt 121 lb (54.9 kg)   BMI 21.43 kg/m   Physical Exam:  Well appearing 32 yo woman, NAD HEENT: Unremarkable Neck:  6 cm JVD, no thyromegally Lymphatics:  No adenopathy Back:  No CVA tenderness Lungs:  Clear with no wheezes HEART:  Regular rate rhythm, no murmurs, no rubs, no clicks Abd:  soft, positive bowel sounds, no organomegally, no rebound, no guarding Ext:  2 plus pulses, no edema, no cyanosis, no clubbing Skin:  No rashes no nodules Neuro:  CN II through XII intact, motor grossly intact  EKG - nsr with a  Short PR but no pre-excitation.  Assess/Plan: 1. SVT - the patient continues to do well. On medical therapy for SVT. I have discussed the treatment options with the patient and she will continue with her beta blocker. She notes that [place] the [place] area and I have given her the name of Dr[facility] Marcell[facility] Cox [facility]   Leonia ReevesGregg Taylor,M.D.

## 2017-06-13 NOTE — Patient Instructions (Signed)

## 2017-07-05 ENCOUNTER — Other Ambulatory Visit: Payer: Self-pay | Admitting: Cardiology

## 2017-07-05 DIAGNOSIS — I471 Supraventricular tachycardia: Secondary | ICD-10-CM

## 2017-07-07 NOTE — Telephone Encounter (Signed)
REFILL 

## 2017-08-02 IMAGING — CR DG CHEST 2V
2 series · 2 of 2 positions shown · non-contrast
Comparison: None.

CLINICAL DATA: Chest pain and tightness, initial encounter

EXAM:
CHEST  2 VIEW

[w chest pa]
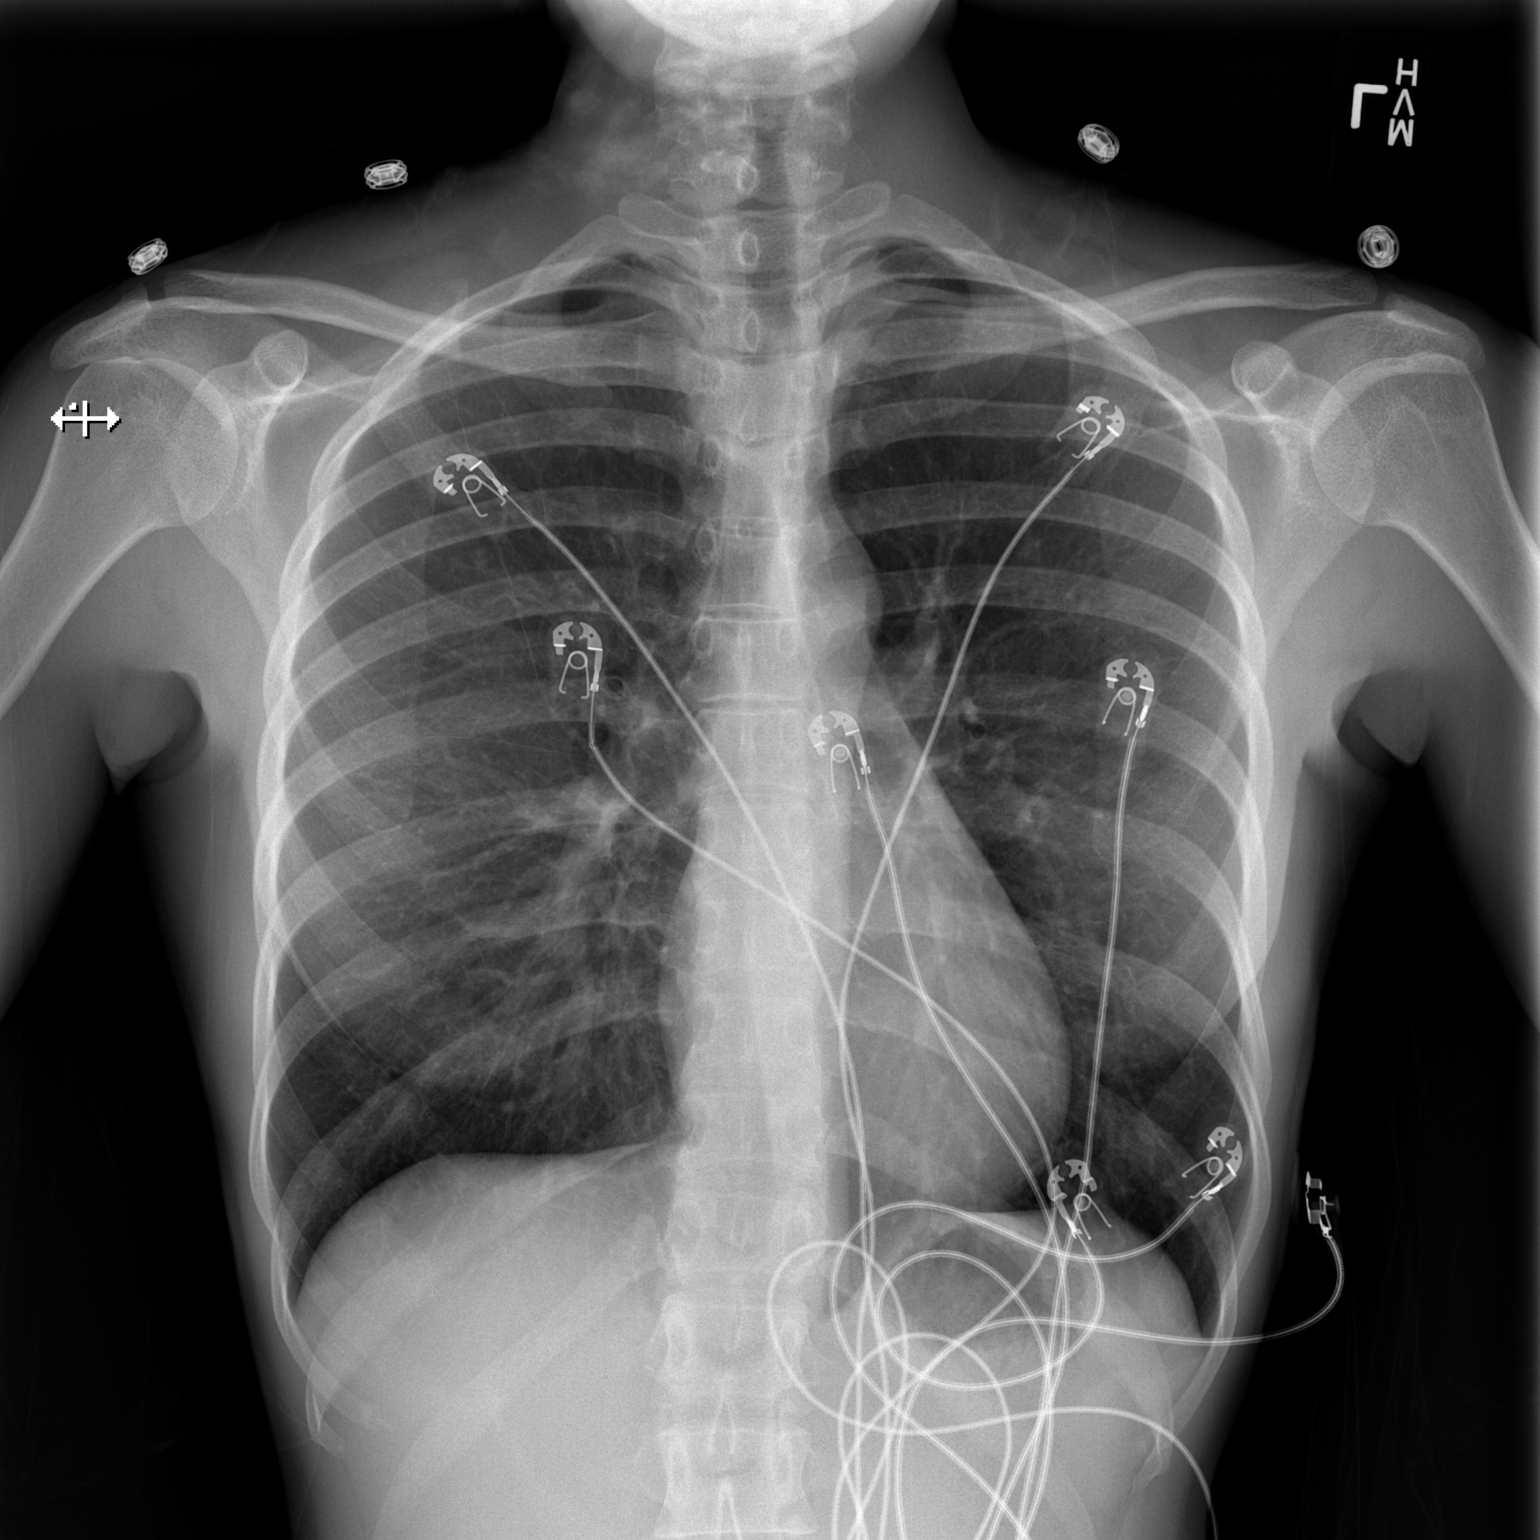

[w chest lat]
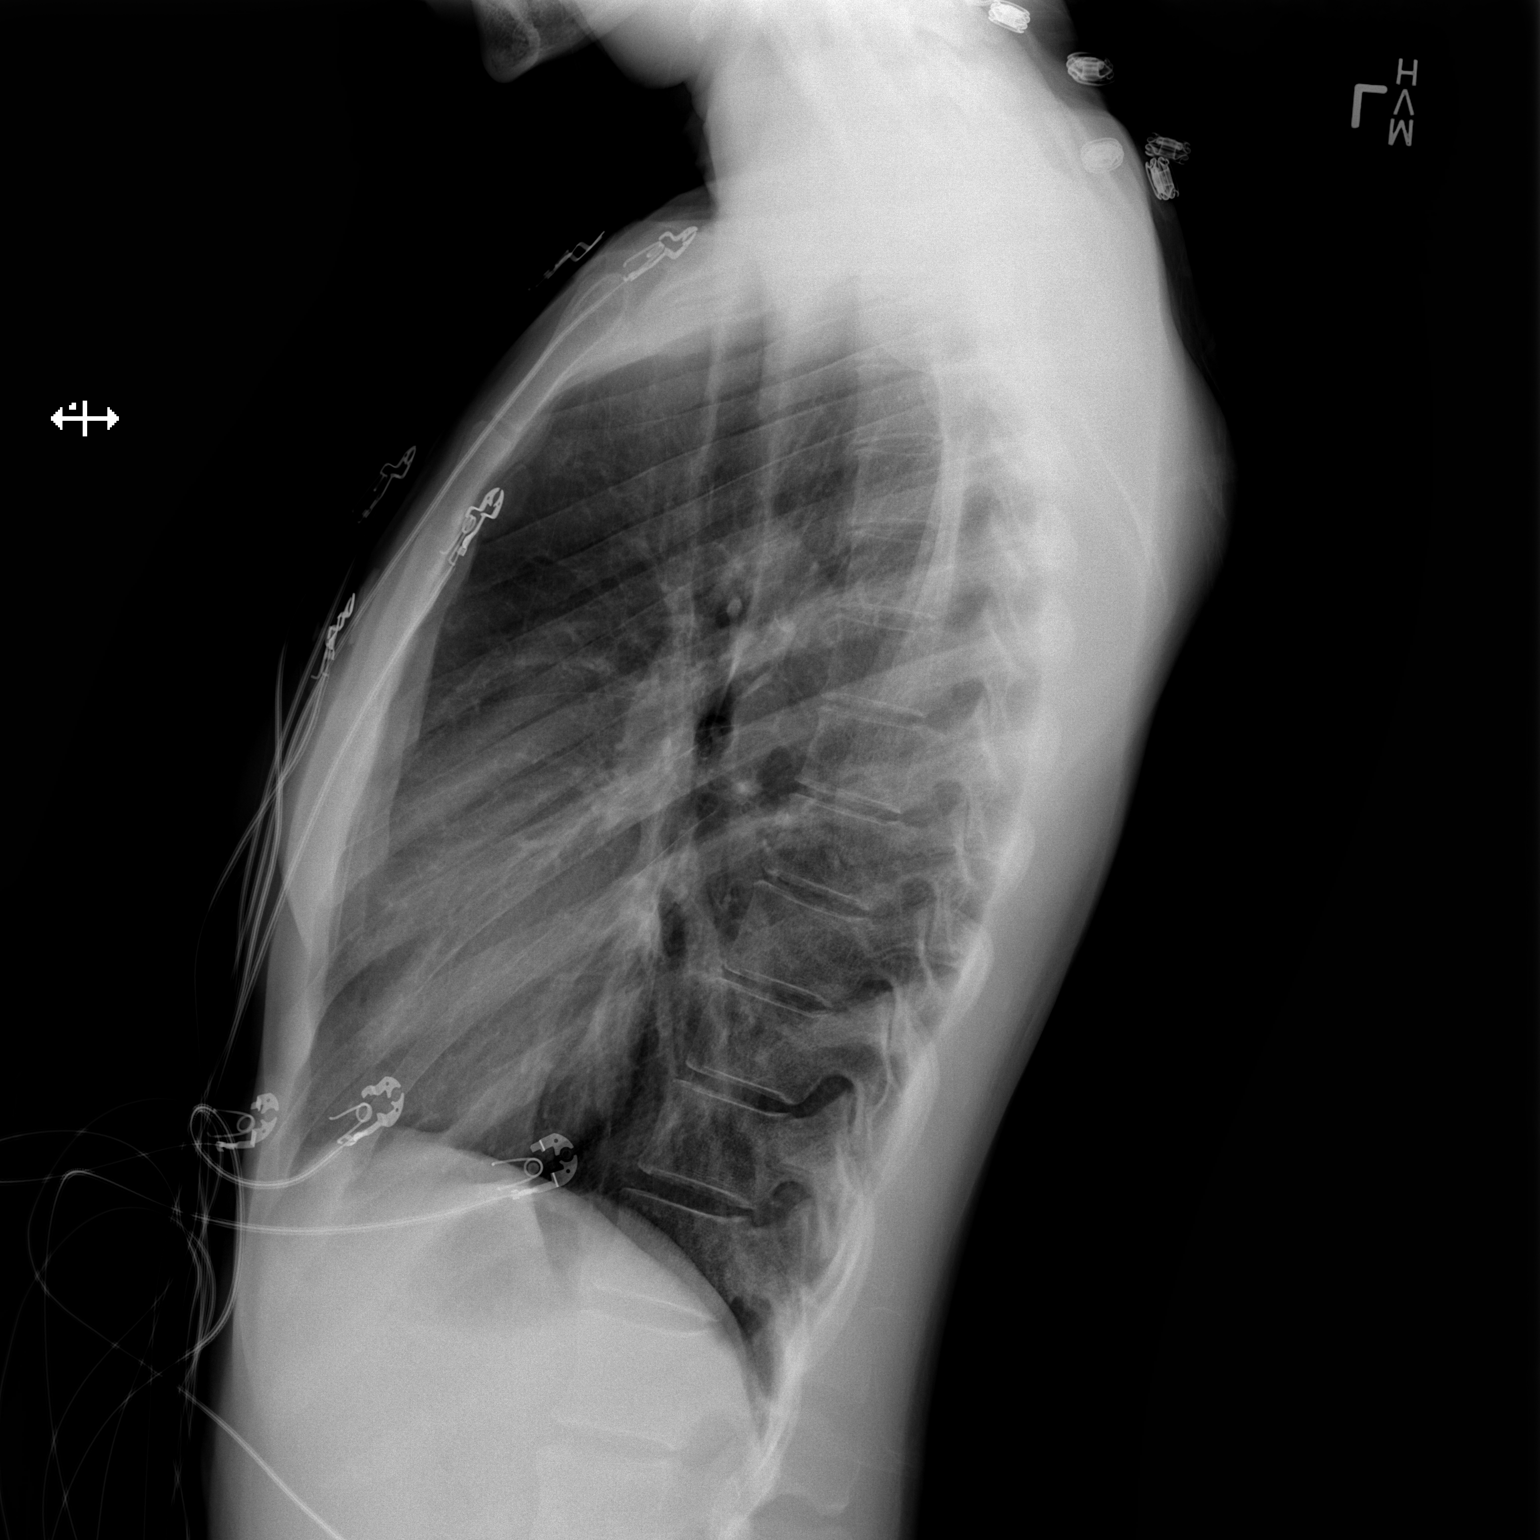

[2 of 2 positions shown; findings below may reference images not displayed]

FINDINGS: The heart size and mediastinal contours are within normal limits.
Both lungs are clear. The visualized skeletal structures are
unremarkable.
IMPRESSION: No active cardiopulmonary disease.
